# Patient Record
Sex: Female | Born: 1960 | Race: White | Hispanic: No | Marital: Married | Smoking: Never smoker
Health system: Southern US, Community
[De-identification: ages and names within clinical notes are randomized; demographics above are authoritative.]

## PROBLEM LIST (undated history)

## (undated) DIAGNOSIS — I493 Ventricular premature depolarization: Secondary | ICD-10-CM

## (undated) DIAGNOSIS — R001 Bradycardia, unspecified: Secondary | ICD-10-CM

## (undated) DIAGNOSIS — M7752 Other enthesopathy of left foot: Secondary | ICD-10-CM

## (undated) HISTORY — PX: TRANSTHORACIC ECHOCARDIOGRAM: SHX275

## (undated) HISTORY — DX: Ventricular premature depolarization: I49.3

## (undated) HISTORY — DX: Other enthesopathy of left foot and ankle: M77.52

## (undated) HISTORY — PX: AUGMENTATION MAMMAPLASTY: SUR837

## (undated) HISTORY — DX: Bradycardia, unspecified: R00.1

---

## 2007-10-18 LAB — HM PAP SMEAR: HM Pap smear: NORMAL

## 2012-04-08 ENCOUNTER — Ambulatory Visit: Payer: Self-pay | Admitting: Internal Medicine

## 2012-07-24 ENCOUNTER — Ambulatory Visit (INDEPENDENT_AMBULATORY_CARE_PROVIDER_SITE_OTHER): Payer: Self-pay | Admitting: Internal Medicine

## 2012-07-24 ENCOUNTER — Encounter: Payer: Self-pay | Admitting: Internal Medicine

## 2012-07-24 VITALS — BP 120/86 | HR 84 | Temp 98.5°F | Ht 65.0 in | Wt 197.0 lb

## 2012-07-24 DIAGNOSIS — M659 Synovitis and tenosynovitis, unspecified: Secondary | ICD-10-CM

## 2012-07-24 DIAGNOSIS — M7750 Other enthesopathy of unspecified foot: Secondary | ICD-10-CM

## 2012-07-24 DIAGNOSIS — R030 Elevated blood-pressure reading, without diagnosis of hypertension: Secondary | ICD-10-CM

## 2012-07-24 DIAGNOSIS — Z1239 Encounter for other screening for malignant neoplasm of breast: Secondary | ICD-10-CM

## 2012-07-24 DIAGNOSIS — R232 Flushing: Secondary | ICD-10-CM

## 2012-07-24 DIAGNOSIS — N951 Menopausal and female climacteric states: Secondary | ICD-10-CM

## 2012-07-24 NOTE — Patient Instructions (Addendum)
Your blood pressure is mildly elevated (144/84 by Dr Melina Schools check)  Please check it 3 times over the next month while calmly resting and e mail me the results (pulse, too)    Return after Easter for fasting labs and a urine test for protein    Mammogram to be set up at [facility]   Return for a full physical in the next few months

## 2012-07-24 NOTE — Progress Notes (Signed)
Patient ID: Wendy Gonzales, female   DOB: [birth year], 52 y.o.   MRN: [id number]  Patient Active Problem List  Diagnosis  . Perimenopause  . Tendonitis of ankle or foot  . Elevated blood pressure (not hypertension)    Subjective:  CC:   Chief Complaint  Patient presents with  . Establish Care    HPI:   Wendy Gonzales is a 52 y.o. female who presents as a new patient to establish primary care with the chief complaint of  Perimenopause. Her last menstrual period was 5 months ago. She has been having frequent hot flashes which are waking her up at times. She's not drenching the bed but she is nonetheless uncomfortable. She has no other symptoms of menopause including vaginal dryness and mood lability. She was under the impression that she had already entered menopause and was disappointed to find that she could still conceive despite being so miserable. She is not interested in hormone therapy. She has a healthy sense of humor about her situation.   Foot pain. She developed tendinitis in the left foot 9 months ago after running into a chair at work and inflicting.trauma to the top of her foot. She did not see any bruising and  add only minimal swelling so she did not have it x-rayed or evaluated at the time. Her swelling was worse by the end of the day so she would ice it would come home. However after several months of persistent pain she went to an urgent care last month and received a prescription for ibuprofen 800 mg every 8 hours which has been taking for the past 2 weeks.  Her pain is finally improving but not resolved.      Past Medical History  Diagnosis Date  . Tendonitis of ankle, left     History reviewed. No pertinent past surgical history.  Family History  Problem Relation Age of Onset  . Cataracts Mother   . Hypertension Mother   . Hypertension Father     History   Social History  . Marital Status: Married    Spouse Name: N/A    Number of Children: 2  . Years of  Education: 18   Occupational History  . teacher     Clinical cytogeneticist.   Social History Main Topics  . Smoking status: Never Smoker   . Smokeless tobacco: Never Used  . Alcohol Use: 2.4 oz/week    4 Glasses of wine per week     Comment: Socially  . Drug Use: No  . Sexually Active: Not on file   Other Topics Concern  . Not on file   Social History Narrative  . School Runner, broadcasting/film/video at [employer]    Allergies  Allergen Reactions  . Sulfa Antibiotics Rash      Review of Systems:   Patient denies headache, fevers, malaise, unintentional weight loss, skin rash, eye pain, sinus congestion and sinus pain, sore throat, dysphagia,  hemoptysis , cough, dyspnea, wheezing, chest pain, palpitations, orthopnea, edema, abdominal pain, nausea, melena, diarrhea, constipation, flank pain, dysuria, hematuria, urinary  Frequency, nocturia, numbness, tingling, seizures,  Focal weakness, Loss of consciousness,  Tremor, insomnia, depression, anxiety, and suicidal ideation.    Objective:  BP 120/86  Pulse 84  Temp(Src) 98.5 F (36.9 C) (Oral)  Ht 5\' 5"  (1.651 m)  Wt 197 lb (89.359 kg)  BMI 32.78 kg/m2  SpO2 97%  LMP 03/25/2012  General appearance: alert, cooperative and appears stated age Ears: normal TM's  and external ear canals both ears Throat: lips, mucosa, and tongue normal; teeth and gums normal Neck: no adenopathy, no carotid bruit, supple, symmetrical, trachea midline and thyroid not enlarged, symmetric, no tenderness/mass/nodules Back: symmetric, no curvature. ROM normal. No CVA tenderness. Lungs: clear to auscultation bilaterally Heart: regular rate and rhythm, S1, S2 normal, no murmur, click, rub or gallop Abdomen: soft, non-tender; bowel sounds normal; no masses,  no organomegaly Pulses: 2+ and symmetric Skin: Skin color, texture, turgor normal. No rashes or lesions Lymph nodes: Cervical, supraclavicular, and axillary nodes normal.  Assessment and  Plan:  Perimenopause Discussed her current symptoms and what she can expect. She is able to manage her symptoms with a sense of humor and does not want any hormonal therapy at this time. Cautioned her to use barrier protection for birth control until she has gone a year without a period.  Tendonitis of ankle or foot Chronic secondary to blunt trauma experienced about 8 months ago. She's been taking an anti-inflammatory for 2 weeks. Recommend continuing to do so for the next 2 weeks. No gastritis symptoms. If her symptoms have not significantly improved we will refer her to Dr. Gwyneth Revels, podiatry for evaluation.  Elevated blood pressure (not hypertension) She has no history of hypertension but has not had any primary care in several years. Since her mother has hypertension and has a home cuff I have asked her to check her pressure several times over the next several weeks along with pulse. I will have her return for fasting labs and a full physical to get her caught up on screening including Pap smears mammograms and colon cancer screening   Updated Medication List Outpatient Encounter Prescriptions as of 07/24/2012  Medication Sig Dispense Refill  . ibuprofen (ADVIL,MOTRIN) 600 MG tablet Take 600 mg by mouth every 6 (six) hours as needed for pain.       No facility-administered encounter medications on file as of 07/24/2012.     Orders Placed This Encounter  Procedures  . MM Digital Screening  . CBC with Differential  . TSH  . Comprehensive metabolic panel  . Lipid panel  . Microalbumin / creatinine urine ratio    No Follow-up on file.

## 2012-07-26 ENCOUNTER — Encounter: Payer: Self-pay | Admitting: Internal Medicine

## 2012-07-26 DIAGNOSIS — N951 Menopausal and female climacteric states: Secondary | ICD-10-CM | POA: Insufficient documentation

## 2012-07-26 DIAGNOSIS — M7750 Other enthesopathy of unspecified foot: Secondary | ICD-10-CM | POA: Insufficient documentation

## 2012-07-26 DIAGNOSIS — R030 Elevated blood-pressure reading, without diagnosis of hypertension: Secondary | ICD-10-CM | POA: Insufficient documentation

## 2012-07-26 NOTE — Assessment & Plan Note (Signed)
Discussed her current symptoms and what she can expect. She is able to manage her symptoms with a sense of humor and is not want any hormonal therapy at this time. Cautioned her to use it least barrier protection for birth control until she has gone a year without a period.

## 2012-07-26 NOTE — Assessment & Plan Note (Signed)
She has no history of hypertension but has not had any primary care in several years. Since her mother has a blood pressure cuff I have asked her to check her pressure several times over the next several weeks and 2 record pulses well. I will have her return for fasting labs and a full physical to get her caught up on screening including Pap smears mammograms and colon cancer screening

## 2012-07-26 NOTE — Assessment & Plan Note (Signed)
Chronic secondary to blunt trauma experienced about 8 months ago. She's been taking an anti-inflammatory for 2 weeks. Recommend continuing to do so for the next 2 weeks. Her symptoms have not significantly improved we will refer her to Dr. Ether Griffins, podiatry for evaluation.

## 2012-08-13 ENCOUNTER — Other Ambulatory Visit (INDEPENDENT_AMBULATORY_CARE_PROVIDER_SITE_OTHER): Payer: Self-pay

## 2012-08-13 DIAGNOSIS — R232 Flushing: Secondary | ICD-10-CM

## 2012-08-13 DIAGNOSIS — R030 Elevated blood-pressure reading, without diagnosis of hypertension: Secondary | ICD-10-CM

## 2012-08-13 DIAGNOSIS — N951 Menopausal and female climacteric states: Secondary | ICD-10-CM

## 2012-08-13 LAB — CBC WITH DIFFERENTIAL/PLATELET
Basophils Absolute: 0 K/uL (ref 0.0–0.1)
Basophils Relative: 0.6 % (ref 0.0–3.0)
Eosinophils Absolute: 0.1 K/uL (ref 0.0–0.7)
Eosinophils Relative: 1 % (ref 0.0–5.0)
HCT: 38.8 % (ref 36.0–46.0)
Hemoglobin: 13.2 g/dL (ref 12.0–15.0)
Lymphocytes Relative: 26.5 % (ref 12.0–46.0)
Lymphs Abs: 1.6 K/uL (ref 0.7–4.0)
MCHC: 33.9 g/dL (ref 30.0–36.0)
MCV: 88 fl (ref 78.0–100.0)
Monocytes Absolute: 0.4 K/uL (ref 0.1–1.0)
Monocytes Relative: 7 % (ref 3.0–12.0)
Neutro Abs: 4 K/uL (ref 1.4–7.7)
Neutrophils Relative %: 64.9 % (ref 43.0–77.0)
Platelets: 307 K/uL (ref 150.0–400.0)
RBC: 4.41 Mil/uL (ref 3.87–5.11)
RDW: 13.9 % (ref 11.5–14.6)
WBC: 6.2 K/uL (ref 4.5–10.5)

## 2012-08-13 LAB — COMPREHENSIVE METABOLIC PANEL
AST: 28 U/L (ref 0–37)
BUN: 11 mg/dL (ref 6–23)
Calcium: 9.2 mg/dL (ref 8.4–10.5)
Chloride: 102 mEq/L (ref 96–112)
Creatinine, Ser: 0.8 mg/dL (ref 0.4–1.2)
GFR: 84.83 mL/min (ref >60.00)
Total Bilirubin: 0.7 mg/dL (ref 0.3–1.2)

## 2012-08-13 LAB — LIPID PANEL
HDL: 52.9 mg/dL (ref >39.00)
Triglycerides: 96 mg/dL (ref 0.0–149.0)
VLDL: 19.2 mg/dL (ref 0.0–40.0)

## 2012-08-13 LAB — MICROALBUMIN / CREATININE URINE RATIO
Creatinine,U: 40.1 mg/dL
Microalb Creat Ratio: 0.2 mg/g (ref 0.0–30.0)
Microalb, Ur: 0.1 mg/dL (ref 0.0–1.9)

## 2012-08-13 LAB — LDL CHOLESTEROL, DIRECT: Direct LDL: 165.6 mg/dL

## 2012-08-14 ENCOUNTER — Encounter: Payer: Self-pay | Admitting: Internal Medicine

## 2012-09-04 ENCOUNTER — Encounter: Payer: Self-pay | Admitting: Internal Medicine

## 2012-09-27 LAB — HM PAP SMEAR: HM PAP: NORMAL

## 2012-10-09 ENCOUNTER — Encounter: Payer: Self-pay | Admitting: Internal Medicine

## 2012-10-13 ENCOUNTER — Encounter: Payer: Self-pay | Admitting: Internal Medicine

## 2012-10-14 ENCOUNTER — Encounter: Payer: Self-pay | Admitting: Internal Medicine

## 2012-10-16 ENCOUNTER — Encounter: Payer: Self-pay | Admitting: Internal Medicine

## 2012-10-17 ENCOUNTER — Encounter: Payer: Self-pay | Admitting: Internal Medicine

## 2012-10-17 ENCOUNTER — Other Ambulatory Visit (HOSPITAL_COMMUNITY)
Admission: RE | Admit: 2012-10-17 | Discharge: 2012-10-17 | Disposition: A | Discharge status: Home / Self Care | Payer: BC Managed Care – PPO | Source: Ambulatory Visit | Attending: Internal Medicine | Admitting: Internal Medicine

## 2012-10-17 ENCOUNTER — Ambulatory Visit (INDEPENDENT_AMBULATORY_CARE_PROVIDER_SITE_OTHER): Payer: BC Managed Care – PPO | Admitting: Internal Medicine

## 2012-10-17 VITALS — BP 118/78 | HR 84 | Temp 98.5°F | Resp 16 | Ht 66.0 in | Wt 186.2 lb

## 2012-10-17 DIAGNOSIS — N951 Menopausal and female climacteric states: Secondary | ICD-10-CM

## 2012-10-17 DIAGNOSIS — Z01419 Encounter for gynecological examination (general) (routine) without abnormal findings: Secondary | ICD-10-CM | POA: Insufficient documentation

## 2012-10-17 DIAGNOSIS — R030 Elevated blood-pressure reading, without diagnosis of hypertension: Secondary | ICD-10-CM

## 2012-10-17 DIAGNOSIS — Z1239 Encounter for other screening for malignant neoplasm of breast: Secondary | ICD-10-CM

## 2012-10-17 DIAGNOSIS — Z1151 Encounter for screening for human papillomavirus (HPV): Secondary | ICD-10-CM | POA: Insufficient documentation

## 2012-10-17 DIAGNOSIS — Z0000 Encounter for general adult medical examination without abnormal findings: Secondary | ICD-10-CM

## 2012-10-17 NOTE — Progress Notes (Signed)
Patient ID: Wendy Gonzales, female   DOB: May 12, 1960, 52 y.o.   MRN: [id number]  Subjective:     Wendy Gonzales is a 52 y.o. female and is here for a comprehensive physical exam. The patient reports .Marland Kitchen  History   Social History  . Marital Status: Married    Spouse Name: N/A    Number of Children: 2  . Years of Education: 18   Occupational History  . teacher     Clinical cytogeneticist.   Social History Main Topics  . Smoking status: Never Smoker   . Smokeless tobacco: Never Used  . Alcohol Use: 2.4 oz/week    4 Glasses of wine per week     Comment: Socially  . Drug Use: No  . Sexually Active: Not on file   Other Topics Concern  . Not on file   Social History Narrative  . No narrative on file   Health Maintenance  Topic Date Due  . Tetanus/tdap  04/14/1979  . Mammogram  04/13/2010  . Colonoscopy  04/13/2010  . Pap Smear  10/18/2010  . Influenza Vaccine  12/23/2012    The following portions of the patient's history were reviewed and updated as appropriate: allergies, current medications, past family history, past medical history, past social history, past surgical history and problem list.  Review of Systems A comprehensive review of systems was negative.   Objective:  BP 118/78  Pulse 84  Temp(Src) 98.5 F (36.9 C) (Oral)  Resp 16  Ht 5\' 6"  (1.676 m)  Wt 186 lb 4 oz (84.482 kg)  BMI 30.08 kg/m2  SpO2 98%  LMP 03/25/2012  General Appearance:    Alert, cooperative, no distress, appears stated age  Head:    Normocephalic, without obvious abnormality, atraumatic  Eyes:    PERRL, conjunctiva/corneas clear, EOM's intact, fundi    benign, both eyes  Ears:    Normal TM's and external ear canals, both ears  Nose:   Nares normal, septum midline, mucosa normal, no drainage    or sinus tenderness  Throat:   Lips, mucosa, and tongue normal; teeth and gums normal  Neck:   Supple, symmetrical, trachea midline, no adenopathy;    thyroid:  no enlargement/tenderness/nodules;  no carotid   bruit or JVD  Back:     Symmetric, no curvature, ROM normal, no CVA tenderness  Lungs:     Clear to auscultation bilaterally, respirations unlabored  Chest Wall:    No tenderness or deformity   Heart:    Regular rate and rhythm, S1 and S2 normal, no murmur, rub   or gallop  Breast Exam:    No tenderness, masses, or nipple abnormality  Abdomen:     Soft, non-tender, bowel sounds active all four quadrants,    no masses, no organomegaly  Genitalia:    Pelvic: cervix normal in appearance, external genitalia normal, no adnexal masses or tenderness, no cervical motion tenderness, rectovaginal septum normal, uterus normal size, shape, and consistency and vagina normal without discharge  Extremities:   Extremities normal, atraumatic, no cyanosis or edema  Pulses:   2+ and symmetric all extremities  Skin:   Skin color, texture, turgor normal, no rashes or lesions  Lymph nodes:   Cervical, supraclavicular, and axillary nodes normal  Neurologic:   CNII-XII intact, normal strength, sensation and reflexes    throughout    Assessment:   Elevated blood pressure (not hypertension) Home bps are normal,  checking urine for protein  Routine general medical examination  at a health care facility Annual comprehensive exam was done including breast, pelvic and PAP smear. All screenings have been addressed .   Perimenopause Discussed therapy, hormonal and non hormonal. She does not want SSRIs or hormonal therapy trial; Black cohosh root trial recommended    Updated Medication List Outpatient Encounter Prescriptions as of 10/17/2012  Medication Sig Dispense Refill  . ibuprofen (ADVIL,MOTRIN) 600 MG tablet Take 600 mg by mouth every 6 (six) hours as needed for pain.       No facility-administered encounter medications on file as of 10/17/2012.

## 2012-10-17 NOTE — Assessment & Plan Note (Addendum)
Home bps are normal,  checking urine for protein

## 2012-10-17 NOTE — Patient Instructions (Addendum)
You hade your PAP smear done today.  Results should be available in about 1 eek  Please make your mammogram appt at [facility] (here in [place] )  Do the take home stool cards soo nfor colon CA screening  try black cohosh root for hot flashes

## 2012-10-19 ENCOUNTER — Encounter: Payer: Self-pay | Admitting: Internal Medicine

## 2012-10-19 NOTE — Assessment & Plan Note (Signed)
Discussed therapy, hormonal and non hormonal. She does not want SSRIs or hormonal therapy trial; Black cohosh root trial recommended

## 2012-10-19 NOTE — Assessment & Plan Note (Signed)
Annual comprehensive exam was done including breast, pelvic and PAP smear. All screenings have been addressed .  

## 2013-01-27 ENCOUNTER — Encounter: Payer: Self-pay | Admitting: Emergency Medicine

## 2013-02-27 ENCOUNTER — Other Ambulatory Visit: Payer: Self-pay

## 2013-08-06 ENCOUNTER — Ambulatory Visit (INDEPENDENT_AMBULATORY_CARE_PROVIDER_SITE_OTHER): Payer: 59 | Admitting: Adult Health

## 2013-08-06 ENCOUNTER — Encounter: Payer: Self-pay | Admitting: Adult Health

## 2013-08-06 VITALS — BP 114/76 | HR 88 | Temp 98.5°F | Resp 14 | Wt 192.0 lb

## 2013-08-06 DIAGNOSIS — L0390 Cellulitis, unspecified: Secondary | ICD-10-CM

## 2013-08-06 DIAGNOSIS — L0291 Cutaneous abscess, unspecified: Secondary | ICD-10-CM

## 2013-08-06 MED ORDER — DOXYCYCLINE HYCLATE 100 MG PO TABS
100.0000 mg | ORAL_TABLET | Freq: Two times a day (BID) | ORAL | Status: DC
Start: 2013-08-06 — End: 2014-10-28

## 2013-08-06 NOTE — Patient Instructions (Signed)
  Start doxycycline twice a day for 10 days.  Apply warm compresses multiple times throughout the day. Sit in warm bath.  Do not attempt to pop. If the area does not show signs of healing within 5 days please call.  The area may need to be lanced if not fully improved.  Tylenol or ibuprofen for pain.

## 2013-08-06 NOTE — Progress Notes (Signed)
Pre visit review using our clinic review tool, if applicable. No additional management support is needed unless otherwise documented below in the visit note. 

## 2013-08-06 NOTE — Progress Notes (Signed)
Patient ID: Wendy Gonzales, female   DOB: Feb 20, 1961, 53 y.o.   MRN: [id number]   Subjective:    Patient ID: Wendy Gonzales, female    DOB: Feb 20, 1961, 53 y.o.   MRN: [id number]  HPI  Pt is a pleasant 53 y/o female who presents with a painful, swollen area on the right groin. First noticed ~ 3-4 days ago. No fever, chills or general body discomfort. She tried to pop it without any results. She has applied heat to the area but only did this a couple of times.   Past Medical History  Diagnosis Date  . Tendonitis of ankle, left     Current Outpatient Prescriptions on File Prior to Visit  Medication Sig Dispense Refill  . ibuprofen (ADVIL,MOTRIN) 600 MG tablet Take 600 mg by mouth every 6 (six) hours as needed for pain.       No current facility-administered medications on file prior to visit.     Review of Systems  Constitutional: Negative for fever and chills.  Musculoskeletal: Negative.   Skin:       Swollen, red area on right groin  Neurological: Negative.   All other systems reviewed and are negative.      Objective:  BP 114/76  Pulse 88  Temp(Src) 98.5 F (36.9 C) (Oral)  Resp 14  Wt 192 lb (87.091 kg)  SpO2 98%  LMP 03/25/2012   Physical Exam  Constitutional: She is oriented to person, place, and time. She appears well-developed and well-nourished. No distress.  HENT:  Head: Normocephalic and atraumatic.  Eyes: Conjunctivae and EOM are normal.  Neck: Normal range of motion. Neck supple.  Cardiovascular: Normal rate and regular rhythm.   Pulmonary/Chest: Effort normal. No respiratory distress.  Musculoskeletal: Normal range of motion.  Neurological: She is alert and oriented to person, place, and time. She has normal reflexes. Coordination normal.  Skin:  Right groin abscess ~ 3 cm. No drainage. Induration.  Psychiatric: She has a normal mood and affect. Her behavior is normal. Judgment and thought content normal.      Assessment & Plan:   1.  Abscess Doxycycline bid x 10 days. Warm compresses multiple times throughout the day. Do not attempt to manipulate the area. Report fever, worsening symptoms. If no improvement will refer to general sgy to lance.

## 2014-10-20 ENCOUNTER — Ambulatory Visit: Payer: Self-pay | Admitting: Internal Medicine

## 2014-10-28 ENCOUNTER — Encounter: Payer: Self-pay | Admitting: Internal Medicine

## 2014-10-28 ENCOUNTER — Ambulatory Visit (INDEPENDENT_AMBULATORY_CARE_PROVIDER_SITE_OTHER): Payer: 59 | Admitting: Internal Medicine

## 2014-10-28 VITALS — BP 131/85 | HR 84 | Temp 98.2°F | Ht 65.0 in | Wt 199.4 lb

## 2014-10-28 DIAGNOSIS — R5383 Other fatigue: Secondary | ICD-10-CM

## 2014-10-28 DIAGNOSIS — Z1159 Encounter for screening for other viral diseases: Secondary | ICD-10-CM

## 2014-10-28 DIAGNOSIS — E785 Hyperlipidemia, unspecified: Secondary | ICD-10-CM

## 2014-10-28 DIAGNOSIS — E559 Vitamin D deficiency, unspecified: Secondary | ICD-10-CM | POA: Diagnosis not present

## 2014-10-28 DIAGNOSIS — R030 Elevated blood-pressure reading, without diagnosis of hypertension: Secondary | ICD-10-CM

## 2014-10-28 DIAGNOSIS — Z0000 Encounter for general adult medical examination without abnormal findings: Secondary | ICD-10-CM | POA: Diagnosis not present

## 2014-10-28 DIAGNOSIS — Z1239 Encounter for other screening for malignant neoplasm of breast: Secondary | ICD-10-CM

## 2014-10-28 DIAGNOSIS — E669 Obesity, unspecified: Secondary | ICD-10-CM

## 2014-10-28 NOTE — Progress Notes (Signed)
Pre visit review using our clinic review tool, if applicable. No additional management support is needed unless otherwise documented below in the visit note. 

## 2014-10-28 NOTE — Patient Instructions (Addendum)
I want you to lose 15 lbs over the next 6 months  to get your BMI < 30.  Please return for fasting labs soon  Mammogram ordered , will get scheduled at [facility]  The colon cancer screening test will be mailed to your home. In a few days   Health Maintenance Adopting a healthy lifestyle and getting preventive care can go a long way to promote health and wellness. Talk with your health care provider about what schedule of regular examinations is right for you. This is a good chance for you to check in with your provider about disease prevention and staying healthy. In between checkups, there are plenty of things you can do on your own. Experts have done a lot of research about which lifestyle changes and preventive measures are most likely to keep you healthy. Ask your health care provider for more information. WEIGHT AND DIET  Eat a healthy diet  Be sure to include plenty of vegetables, fruits, low-fat dairy products, and lean protein.  Do not eat a lot of foods high in solid fats, added sugars, or salt.  Get regular exercise. This is one of the most important things you can do for your health.  Most adults should exercise for at least 150 minutes each week. The exercise should increase your heart rate and make you sweat (moderate-intensity exercise).  Most adults should also do strengthening exercises at least twice a week. This is in addition to the moderate-intensity exercise.  Maintain a healthy weight  Body mass index (BMI) is a measurement that can be used to identify possible weight problems. It estimates body fat based on height and weight. Your health care provider can help determine your BMI and help you achieve or maintain a healthy weight.  For females 27 years of age and older:   A BMI below 18.5 is considered underweight.  A BMI of 18.5 to 24.9 is normal.  A BMI of 25 to 29.9 is considered overweight.  A BMI of 30 and above is considered obese.  Watch levels of  cholesterol and blood lipids  You should start having your blood tested for lipids and cholesterol at 55 years of age, then have this test every 5 years.  You may need to have your cholesterol levels checked more often if:  Your lipid or cholesterol levels are high.  You are older than 54 years of age.  You are at high risk for heart disease.  CANCER SCREENING   Lung Cancer  Lung cancer screening is recommended for adults 39-31 years old who are at high risk for lung cancer because of a history of smoking.  A yearly low-dose CT scan of the lungs is recommended for people who:  Currently smoke.  Have quit within the past 15 years.  Have at least a 30-pack-year history of smoking. A pack year is smoking an average of one pack of cigarettes a day for 1 year.  Yearly screening should continue until it has been 15 years since you quit.  Yearly screening should stop if you develop a health problem that would prevent you from having lung cancer treatment.  Breast Cancer  Practice breast self-awareness. This means understanding how your breasts normally appear and feel.  It also means doing regular breast self-exams. Let your health care provider know about any changes, no matter how small.  If you are in your 20s or 30s, you should have a clinical breast exam (CBE) by a health care provider every  1-3 years as part of a regular health exam.  If you are 31 or older, have a CBE every year. Also consider having a breast X-ray (mammogram) every year.  If you have a family history of breast cancer, talk to your health care provider about genetic screening.  If you are at high risk for breast cancer, talk to your health care provider about having an MRI and a mammogram every year.  Breast cancer gene (BRCA) assessment is recommended for women who have family members with BRCA-related cancers. BRCA-related cancers include:  Breast.  Ovarian.  Tubal.  Peritoneal  cancers.  Results of the assessment will determine the need for genetic counseling and BRCA1 and BRCA2 testing. Cervical Cancer Routine pelvic examinations to screen for cervical cancer are no longer recommended for nonpregnant women who are considered low risk for cancer of the pelvic organs (ovaries, uterus, and vagina) and who do not have symptoms. A pelvic examination may be necessary if you have symptoms including those associated with pelvic infections. Ask your health care provider if a screening pelvic exam is right for you.   The Pap test is the screening test for cervical cancer for women who are considered at risk.  If you had a hysterectomy for a problem that was not cancer or a condition that could lead to cancer, then you no longer need Pap tests.  If you are older than 65 years, and you have had normal Pap tests for the past 10 years, you no longer need to have Pap tests.  If you have had past treatment for cervical cancer or a condition that could lead to cancer, you need Pap tests and screening for cancer for at least 20 years after your treatment.  If you no longer get a Pap test, assess your risk factors if they change (such as having a new sexual partner). This can affect whether you should start being screened again.  Some women have medical problems that increase their chance of getting cervical cancer. If this is the case for you, your health care provider may recommend more frequent screening and Pap tests.  The human papillomavirus (HPV) test is another test that may be used for cervical cancer screening. The HPV test looks for the virus that can cause cell changes in the cervix. The cells collected during the Pap test can be tested for HPV.  The HPV test can be used to screen women 19 years of age and older. Getting tested for HPV can extend the interval between normal Pap tests from three to five years.  An HPV test also should be used to screen women of any age who  have unclear Pap test results.  After 55 years of age, women should have HPV testing as often as Pap tests.  Colorectal Cancer  This type of cancer can be detected and often prevented.  Routine colorectal cancer screening usually begins at 55 years of age and continues through 55 years of age.  Your health care provider may recommend screening at an earlier age if you have risk factors for colon cancer.  Your health care provider may also recommend using home test kits to check for hidden blood in the stool.  A small camera at the end of a tube can be used to examine your colon directly (sigmoidoscopy or colonoscopy). This is done to check for the earliest forms of colorectal cancer.  Routine screening usually begins at age 66.  Direct examination of the colon should be  repeated every 5-10 years through 55 years of age. However, you may need to be screened more often if early forms of precancerous polyps or small growths are found. Skin Cancer  Check your skin from head to toe regularly.  Tell your health care provider about any new moles or changes in moles, especially if there is a change in a mole's shape or color.  Also tell your health care provider if you have a mole that is larger than the size of a pencil eraser.  Always use sunscreen. Apply sunscreen liberally and repeatedly throughout the day.  Protect yourself by wearing long sleeves, pants, a wide-brimmed hat, and sunglasses whenever you are outside. HEART DISEASE, DIABETES, AND HIGH BLOOD PRESSURE   Have your blood pressure checked at least every 1-2 years. High blood pressure causes heart disease and increases the risk of stroke.  If you are between 30 years and 50 years old, ask your health care provider if you should take aspirin to prevent strokes.  Have regular diabetes screenings. This involves taking a blood sample to check your fasting blood sugar level.  If you are at a normal weight and have a low risk for  diabetes, have this test once every three years after 55 years of age.  If you are overweight and have a high risk for diabetes, consider being tested at a younger age or more often. PREVENTING INFECTION  Hepatitis B  If you have a higher risk for hepatitis B, you should be screened for this virus. You are considered at high risk for hepatitis B if:  You were born in a country where hepatitis B is common. Ask your health care provider which countries are considered high risk.  Your parents were born in a high-risk country, and you have not been immunized against hepatitis B (hepatitis B vaccine).  You have HIV or AIDS.  You use needles to inject street drugs.  You live with someone who has hepatitis B.  You have had sex with someone who has hepatitis B.  You get hemodialysis treatment.  You take certain medicines for conditions, including cancer, organ transplantation, and autoimmune conditions. Hepatitis C  Blood testing is recommended for:  Everyone born from [birth year] through 1966.  Anyone with known risk factors for hepatitis C. Sexually transmitted infections (STIs)  You should be screened for sexually transmitted infections (STIs) including gonorrhea and chlamydia if:  You are sexually active and are younger than 55 years of age.  You are older than 55 years of age and your health care provider tells you that you are at risk for this type of infection.  Your sexual activity has changed since you were last screened and you are at an increased risk for chlamydia or gonorrhea. Ask your health care provider if you are at risk.  If you do not have HIV, but are at risk, it may be recommended that you take a prescription medicine daily to prevent HIV infection. This is called pre-exposure prophylaxis (PrEP). You are considered at risk if:  You are sexually active and do not regularly use condoms or know the HIV status of your partner(s).  You take drugs by injection.  You are  sexually active with a partner who has HIV. Talk with your health care provider about whether you are at high risk of being infected with HIV. If you choose to begin PrEP, you should first be tested for HIV. You should then be tested every 3 months for as long  as you are taking PrEP.  PREGNANCY   If you are premenopausal and you may become pregnant, ask your health care provider about preconception counseling.  If you may become pregnant, take 400 to 800 micrograms (mcg) of folic acid every day.  If you want to prevent pregnancy, talk to your health care provider about birth control (contraception). OSTEOPOROSIS AND MENOPAUSE   Osteoporosis is a disease in which the bones lose minerals and strength with aging. This can result in serious bone fractures. Your risk for osteoporosis can be identified using a bone density scan.  If you are 21 years of age or older, or if you are at risk for osteoporosis and fractures, ask your health care provider if you should be screened.  Ask your health care provider whether you should take a calcium or vitamin D supplement to lower your risk for osteoporosis.  Menopause may have certain physical symptoms and risks.  Hormone replacement therapy may reduce some of these symptoms and risks. Talk to your health care provider about whether hormone replacement therapy is right for you.  HOME CARE INSTRUCTIONS   Schedule regular health, dental, and eye exams.  Stay current with your immunizations.   Do not use any tobacco products including cigarettes, chewing tobacco, or electronic cigarettes.  If you are pregnant, do not drink alcohol.  If you are breastfeeding, limit how much and how often you drink alcohol.  Limit alcohol intake to no more than 1 drink per day for nonpregnant women. One drink equals 12 ounces of beer, 5 ounces of wine, or 1 ounces of hard liquor.  Do not use street drugs.  Do not share needles.  Ask your health care provider for  help if you need support or information about quitting drugs.  Tell your health care provider if you often feel depressed.  Tell your health care provider if you have ever been abused or do not feel safe at home. Document Released: 10/24/2010 Document Revised: 08/25/2013 Document Reviewed: 03/12/2013 [facility] Patient Information 2015 [place], [place]. This information is not intended to replace advice given to you by your health care provider. Make sure you discuss any questions you have with your health care provider.

## 2014-10-28 NOTE — Progress Notes (Signed)
Patient ID: Wendy Gonzales, female    DOB: [birth year]  Age: 54 y.o. MRN: [id number]  The patient is here for her annual  wellness examination . LAST seen IN 2014   Normal PAP 2014 Mammogram due,  WAS ORDEREDTWICE IN  IN 2014 BUT NEVER DONE  Colonoscopy overdue SINCE AGE 23  The risk factors are reflected in the social history.  The roster of all physicians providing medical care to patient - is listed in the Snapshot section of the chart.  Activities of daily living:  The patient is 100% independent in all ADLs: dressing, toileting, feeding as well as independent mobility  Home safety : The patient has smoke detectors in the home. They wear seatbelts.  There are no firearms at home. There is no violence in the home.   There is no risks for hepatitis, STDs or HIV. There is no   history of blood transfusion. They have no travel history to infectious disease endemic areas of the world.  The patient has seen their dentist in the last six month. They have seen their eye doctor in the last year. They admit to slight hearing difficulty with regard to whispered voices and some television programs.  They have deferred audiologic testing in the last year.  They do not  have excessive sun exposure. Discussed the need for sun protection: hats, long sleeves and use of sunscreen if there is significant sun exposure.   Diet: the importance of a healthy diet is discussed. They do have a healthy diet.  The benefits of regular aerobic exercise were discussed. She walks 4 times per week ,  20 minutes.   Depression screen: there are no signs or vegative symptoms of depression- irritability, change in appetite, anhedonia, sadness/tearfullness.  Cognitive assessment: the patient manages all their financial and personal affairs and is actively engaged. They could relate day,date,year and events; recalled 2/3 objects at 3 minutes; performed clock-face test normally.  The following portions of the patient's history  were reviewed and updated as appropriate: allergies, current medications, past family history, past medical history,  past surgical history, past social history  and problem list.  Visual acuity was not assessed per patient preference since she has regular follow up with her ophthalmologist. Hearing and body mass index were assessed and reviewed.   During the course of the visit the patient was educated and counseled about appropriate screening and preventive services including : fall prevention , diabetes screening, nutrition counseling, colorectal cancer screening, and recommended immunizations.    CC: The primary encounter diagnosis was Need for hepatitis C screening test. Diagnoses of Other fatigue, Vitamin D deficiency, Hyperlipidemia, Breast cancer screening, Visit for preventive health examination, Obesity, and Elevated blood pressure (not hypertension) were also pertinent to this visit.  History Moncerrath has a past medical history of Tendonitis of ankle, left.   She has no past surgical history on file.   Her family history includes Cataracts in her mother; Hypertension in her father and mother.She reports that she has never smoked. She has never used smokeless tobacco. She reports that she drinks about 2.4 oz of alcohol per week. She reports that she does not use illicit drugs.  Outpatient Prescriptions Prior to Visit  Medication Sig Dispense Refill  . doxycycline (VIBRA-TABS) 100 MG tablet Take 1 tablet (100 mg total) by mouth 2 (two) times daily. 20 tablet 0  . ibuprofen (ADVIL,MOTRIN) 600 MG tablet Take 600 mg by mouth every 6 (six) hours as needed for pain.  No facility-administered medications prior to visit.    Review of Systems   Patient denies headache, fevers, malaise, unintentional weight loss, skin rash, eye pain, sinus congestion and sinus pain, sore throat, dysphagia,  hemoptysis , cough, dyspnea, wheezing, chest pain, palpitations, orthopnea, edema, abdominal pain,  nausea, melena, diarrhea, constipation, flank pain, dysuria, hematuria, urinary  Frequency, nocturia, numbness, tingling, seizures,  Focal weakness, Loss of consciousness,  Tremor, insomnia, depression, anxiety, and suicidal ideation.      Objective:  BP 131/85 mmHg  Pulse 84  Temp(Src) 98.2 F (36.8 C) (Oral)  Ht 5\' 5"  (1.651 m)  Wt 199 lb 6 oz (90.436 kg)  BMI 33.18 kg/m2  SpO2 96%  LMP 03/25/2012  Physical Exam   General appearance: alert, cooperative and appears stated age Head: Normocephalic, without obvious abnormality, atraumatic Eyes: conjunctivae/corneas clear. PERRL, EOM's intact. Fundi benign. Ears: normal TM's and external ear canals both ears Nose: Nares normal. Septum midline. Mucosa normal. No drainage or sinus tenderness. Throat: lips, mucosa, and tongue normal; teeth and gums normal Neck: no adenopathy, no carotid bruit, no JVD, supple, symmetrical, trachea midline and thyroid not enlarged, symmetric, no tenderness/mass/nodules Lungs: clear to auscultation bilaterally Breasts: saline implants. normal appearance, no masses or tenderness Heart: regular rate and rhythm, S1, S2 normal, no murmur, click, rub or gallop Abdomen: soft, non-tender; bowel sounds normal; no masses,  no organomegaly Extremities: extremities normal, atraumatic, no cyanosis or edema Pulses: 2+ and symmetric Skin: Skin color, texture, turgor normal. No rashes or lesions Neurologic: Alert and oriented X 3, normal strength and tone. Normal symmetric reflexes. Normal coordination and gait.     Assessment & Plan:   Problem List Items Addressed This Visit      Unprioritized   Elevated blood pressure (not hypertension)    Home bps have been  normal,  checking urine for protein  Lab Results  Component Value Date   MICROALBUR 0.1 08/13/2012        Visit for preventive health examination    Annual wellness  exam was done as well as a comprehensive physical exam  .  During the course of the  visit the patient was educated and counseled about appropriate screening and preventive services and screenings were brought up to date for cervical and breast cancer .  She will return for fasting labs to provide samples for diabetes screening and lipid analysis with projected  10 year  risk for CAD. nutrition counseling, skin cancer screening has been recommended, along with review of the age appropriate recommended immunizations.  Printed recommendations for health maintenance screenings was given.        Obesity    I have addressed  BMI and recommended a low glycemic index diet utilizing smaller more frequent meals to increase metabolism.  I have also recommended that patient start exercising with a goal of 30 minutes of aerobic exercise a minimum of 5 days per week. Screening for lipid disorders, thyroid and diabetes need to be done ASAP.          Other Visit Diagnoses    Need for hepatitis C screening test    -  Primary    Relevant Orders    Hepatitis C antibody    Other fatigue        Relevant Orders    CBC with Differential/Platelet    Comprehensive metabolic panel    TSH    Vitamin D deficiency        Relevant Orders    Vit D  25 hydroxy (rtn osteoporosis monitoring)    Hyperlipidemia        Relevant Orders    Lipid panel    Breast cancer screening        Relevant Orders    MM DIGITAL SCREENING BILATERAL       I have discontinued Ms. Mcgranahan's ibuprofen and doxycycline.  No orders of the defined types were placed in this encounter.    Medications Discontinued During This Encounter  Medication Reason  . doxycycline (VIBRA-TABS) 100 MG tablet Completed Course  . ibuprofen (ADVIL,MOTRIN) 600 MG tablet Patient Preference    Follow-up: Return in about 6 months (around 04/30/2015).   Sherlene Shams, MD

## 2014-10-29 ENCOUNTER — Telehealth: Payer: Self-pay | Admitting: Internal Medicine

## 2014-10-29 NOTE — Telephone Encounter (Signed)
Faxed form for Cologuard with demographics.

## 2014-10-31 DIAGNOSIS — E669 Obesity, unspecified: Secondary | ICD-10-CM | POA: Insufficient documentation

## 2014-10-31 NOTE — Assessment & Plan Note (Signed)

## 2014-10-31 NOTE — Assessment & Plan Note (Addendum)
Home bps have been  normal,  checking urine for protein  Lab Results  Component Value Date   MICROALBUR 0.1 08/13/2012

## 2014-10-31 NOTE — Assessment & Plan Note (Signed)
I have addressed  BMI and recommended a low glycemic index diet utilizing smaller more frequent meals to increase metabolism.  I have also recommended that patient start exercising with a goal of 30 minutes of aerobic exercise a minimum of 5 days per week. Screening for lipid disorders, thyroid and diabetes need to be done ASAP.

## 2014-11-04 ENCOUNTER — Ambulatory Visit
Admission: RE | Admit: 2014-11-04 | Discharge: 2014-11-04 | Disposition: A | Discharge status: Home / Self Care | Payer: 59 | Source: Ambulatory Visit | Attending: Internal Medicine | Admitting: Internal Medicine

## 2014-11-04 ENCOUNTER — Other Ambulatory Visit: Payer: Self-pay | Admitting: Internal Medicine

## 2014-11-04 ENCOUNTER — Telehealth: Payer: Self-pay | Admitting: Internal Medicine

## 2014-11-04 DIAGNOSIS — N6459 Other signs and symptoms in breast: Secondary | ICD-10-CM

## 2014-11-04 DIAGNOSIS — Z1231 Encounter for screening mammogram for malignant neoplasm of breast: Secondary | ICD-10-CM | POA: Insufficient documentation

## 2014-11-04 DIAGNOSIS — N6489 Other specified disorders of breast: Secondary | ICD-10-CM

## 2014-11-04 DIAGNOSIS — N6320 Unspecified lump in the left breast, unspecified quadrant: Secondary | ICD-10-CM

## 2014-11-04 DIAGNOSIS — Z1239 Encounter for other screening for malignant neoplasm of breast: Secondary | ICD-10-CM

## 2014-11-04 LAB — COLOGUARD

## 2014-11-04 NOTE — Telephone Encounter (Signed)
Notified patient and she verbalized understanding.

## 2014-11-04 NOTE — Telephone Encounter (Signed)
Your  mammogram was reported to me as abnormal.   Additional images are needed of  Both breasts,  And Wendy Gonzales will contact you directly to get those scheduled. If you do not hear from them by Monday, please call the office .

## 2014-11-05 ENCOUNTER — Other Ambulatory Visit (INDEPENDENT_AMBULATORY_CARE_PROVIDER_SITE_OTHER): Payer: 59

## 2014-11-05 DIAGNOSIS — E785 Hyperlipidemia, unspecified: Secondary | ICD-10-CM | POA: Diagnosis not present

## 2014-11-05 DIAGNOSIS — R5383 Other fatigue: Secondary | ICD-10-CM

## 2014-11-05 LAB — CBC WITH DIFFERENTIAL/PLATELET
Basophils Absolute: 0 10*3/uL (ref 0.0–0.1)
Basophils Relative: 0.5 % (ref 0.0–3.0)
Eosinophils Absolute: 0.1 10*3/uL (ref 0.0–0.7)
Eosinophils Relative: 1 % (ref 0.0–5.0)
HCT: 41.6 % (ref 36.0–46.0)
Hemoglobin: 14.1 g/dL (ref 12.0–15.0)
LYMPHS PCT: 24.1 % (ref 12.0–46.0)
Lymphs Abs: 1.6 10*3/uL (ref 0.7–4.0)
MCHC: 33.8 g/dL (ref 30.0–36.0)
MCV: 88.4 fl (ref 78.0–100.0)
Monocytes Absolute: 0.5 10*3/uL (ref 0.1–1.0)
Monocytes Relative: 7 % (ref 3.0–12.0)
Neutro Abs: 4.6 10*3/uL (ref 1.4–7.7)
Neutrophils Relative %: 67.4 % (ref 43.0–77.0)
Platelets: 316 10*3/uL (ref 150.0–400.0)
RBC: 4.71 Mil/uL (ref 3.87–5.11)
RDW: 14.2 % (ref 11.5–15.5)
WBC: 6.9 10*3/uL (ref 4.0–10.5)

## 2014-11-05 LAB — COMPREHENSIVE METABOLIC PANEL
ALK PHOS: 115 U/L (ref 39–117)
ALT: 39 U/L — AB (ref 0–35)
AST: 42 U/L — ABNORMAL HIGH (ref 0–37)
Albumin: 4.3 g/dL (ref 3.5–5.2)
BILIRUBIN TOTAL: 0.5 mg/dL (ref 0.2–1.2)
BUN: 11 mg/dL (ref 6–23)
CHLORIDE: 104 meq/L (ref 96–112)
CO2: 28 mEq/L (ref 19–32)
CREATININE: 0.78 mg/dL (ref 0.40–1.20)
Calcium: 9.6 mg/dL (ref 8.4–10.5)
GFR: 81.63 mL/min (ref >60.00)
Glucose, Bld: 107 mg/dL — ABNORMAL HIGH (ref 70–99)
POTASSIUM: 4.2 meq/L (ref 3.5–5.1)
SODIUM: 139 meq/L (ref 135–145)
Total Protein: 7.8 g/dL (ref 6.0–8.3)

## 2014-11-05 LAB — LIPID PANEL
CHOLESTEROL: 225 mg/dL — AB (ref 0–200)
HDL: 65.3 mg/dL (ref >39.00)
LDL Cholesterol: 145 mg/dL — ABNORMAL HIGH (ref 0–99)
NONHDL: 159.7
Total CHOL/HDL Ratio: 3
Triglycerides: 74 mg/dL (ref 0.0–149.0)
VLDL: 14.8 mg/dL (ref 0.0–40.0)

## 2014-11-05 LAB — TSH: TSH: 0.91 u[IU]/mL (ref 0.35–4.50)

## 2014-11-08 ENCOUNTER — Encounter: Payer: Self-pay | Admitting: Internal Medicine

## 2014-11-08 ENCOUNTER — Other Ambulatory Visit: Payer: Self-pay | Admitting: Internal Medicine

## 2014-11-08 DIAGNOSIS — R7401 Elevation of levels of liver transaminase levels: Secondary | ICD-10-CM

## 2014-11-08 DIAGNOSIS — R740 Nonspecific elevation of levels of transaminase and lactic acid dehydrogenase [LDH]: Principal | ICD-10-CM

## 2014-11-11 ENCOUNTER — Ambulatory Visit
Admission: RE | Admit: 2014-11-11 | Discharge: 2014-11-11 | Disposition: A | Discharge status: Home / Self Care | Payer: 59 | Source: Ambulatory Visit | Attending: Internal Medicine | Admitting: Internal Medicine

## 2014-11-11 ENCOUNTER — Ambulatory Visit: Payer: 59

## 2014-11-11 DIAGNOSIS — N6320 Unspecified lump in the left breast, unspecified quadrant: Secondary | ICD-10-CM

## 2014-11-11 DIAGNOSIS — N6459 Other signs and symptoms in breast: Secondary | ICD-10-CM

## 2014-11-11 DIAGNOSIS — R928 Other abnormal and inconclusive findings on diagnostic imaging of breast: Secondary | ICD-10-CM | POA: Diagnosis present

## 2014-11-11 DIAGNOSIS — N6489 Other specified disorders of breast: Secondary | ICD-10-CM | POA: Diagnosis present

## 2014-11-11 DIAGNOSIS — R590 Localized enlarged lymph nodes: Secondary | ICD-10-CM | POA: Insufficient documentation

## 2014-11-11 DIAGNOSIS — N63 Unspecified lump in breast: Secondary | ICD-10-CM | POA: Diagnosis present

## 2014-11-16 ENCOUNTER — Telehealth: Payer: Self-pay | Admitting: Internal Medicine

## 2014-11-16 ENCOUNTER — Encounter: Payer: Self-pay | Admitting: *Deleted

## 2014-11-16 NOTE — Telephone Encounter (Signed)
colo guard test was negative  Please abstract and notify patient.

## 2014-11-16 NOTE — Telephone Encounter (Signed)
Letter mailed

## 2014-12-01 ENCOUNTER — Encounter: Payer: Self-pay | Admitting: Internal Medicine

## 2014-12-07 ENCOUNTER — Other Ambulatory Visit (INDEPENDENT_AMBULATORY_CARE_PROVIDER_SITE_OTHER): Payer: 59

## 2014-12-07 DIAGNOSIS — E559 Vitamin D deficiency, unspecified: Secondary | ICD-10-CM | POA: Diagnosis not present

## 2014-12-07 DIAGNOSIS — R740 Nonspecific elevation of levels of transaminase and lactic acid dehydrogenase [LDH]: Secondary | ICD-10-CM

## 2014-12-07 DIAGNOSIS — Z1159 Encounter for screening for other viral diseases: Secondary | ICD-10-CM

## 2014-12-07 DIAGNOSIS — R7401 Elevation of levels of liver transaminase levels: Secondary | ICD-10-CM

## 2014-12-07 LAB — HEPATIC FUNCTION PANEL
ALBUMIN: 4.3 g/dL (ref 3.5–5.2)
ALT: 36 U/L — ABNORMAL HIGH (ref 0–35)
AST: 37 U/L (ref 0–37)
Alkaline Phosphatase: 113 U/L (ref 39–117)
Bilirubin, Direct: 0 mg/dL (ref 0.0–0.3)
Total Bilirubin: 0.4 mg/dL (ref 0.2–1.2)
Total Protein: 7.6 g/dL (ref 6.0–8.3)

## 2014-12-07 LAB — FERRITIN: Ferritin: 34.6 ng/mL (ref 10.0–291.0)

## 2014-12-07 LAB — VITAMIN D 25 HYDROXY (VIT D DEFICIENCY, FRACTURES): VITD: 27.98 ng/mL — ABNORMAL LOW (ref 30.00–100.00)

## 2014-12-08 LAB — HEPATITIS C ANTIBODY
HCV Ab: NEGATIVE
HCV Ab: NEGATIVE

## 2014-12-08 LAB — ANA: Anti Nuclear Antibody(ANA): NEGATIVE

## 2014-12-08 LAB — ANTI-SMITH ANTIBODY: ENA SM AB SER-ACNC: NEGATIVE

## 2014-12-08 LAB — HEPATITIS B SURFACE ANTIBODY,QUALITATIVE: Hep B S Ab: POSITIVE — AB

## 2014-12-08 LAB — IRON AND TIBC
%SAT: 26 % (ref 20–55)
Iron: 95 ug/dL (ref 42–145)
TIBC: 367 ug/dL (ref 250–470)
UIBC: 272 ug/dL (ref 125–400)

## 2014-12-08 LAB — HEPATITIS B SURFACE ANTIGEN: HEP B S AG: NEGATIVE

## 2014-12-08 LAB — HEPATITIS B CORE ANTIBODY, TOTAL: Hep B Core Total Ab: NONREACTIVE

## 2014-12-11 ENCOUNTER — Encounter: Payer: Self-pay | Admitting: Internal Medicine

## 2014-12-11 DIAGNOSIS — R748 Abnormal levels of other serum enzymes: Secondary | ICD-10-CM

## 2014-12-18 ENCOUNTER — Ambulatory Visit: Payer: 59

## 2014-12-25 ENCOUNTER — Encounter: Payer: Self-pay | Admitting: Internal Medicine

## 2014-12-25 ENCOUNTER — Ambulatory Visit
Admission: RE | Admit: 2014-12-25 | Discharge: 2014-12-25 | Disposition: A | Discharge status: Home / Self Care | Payer: 59 | Source: Ambulatory Visit | Attending: Internal Medicine | Admitting: Internal Medicine

## 2014-12-25 DIAGNOSIS — K769 Liver disease, unspecified: Secondary | ICD-10-CM | POA: Diagnosis not present

## 2014-12-25 DIAGNOSIS — K8020 Calculus of gallbladder without cholecystitis without obstruction: Secondary | ICD-10-CM | POA: Insufficient documentation

## 2014-12-25 DIAGNOSIS — R945 Abnormal results of liver function studies: Secondary | ICD-10-CM | POA: Diagnosis not present

## 2014-12-25 DIAGNOSIS — R748 Abnormal levels of other serum enzymes: Secondary | ICD-10-CM

## 2015-03-30 ENCOUNTER — Encounter: Payer: Self-pay | Admitting: Internal Medicine

## 2015-05-26 ENCOUNTER — Encounter: Payer: Self-pay | Admitting: Internal Medicine

## 2015-05-27 NOTE — Telephone Encounter (Signed)
Please advise as to coding for visit.

## 2015-05-27 NOTE — Telephone Encounter (Signed)
This must be referring to your mammogram. Can you submit the paperwork to our office manager, Janna Arch?  Regards,   Duncan Dull, MD

## 2016-03-20 IMAGING — US US BREAST LTD UNI LEFT INC AXILLA
1 series · 2 of 2 positions shown · non-contrast
Comparison: Previous exam(s).

CLINICAL DATA: Further evaluation of possible mass lower outer
quadrant left breast and medial right breast

EXAM:
DIGITAL DIAGNOSTIC BILATERAL MAMMOGRAM WITH CAD
ULTRASOUND LEFT BREAST

[Series 1: us breast ltd uni left inc axilla · 0.07mm/px · 2 of 2 slices shown]
[im 1/2]
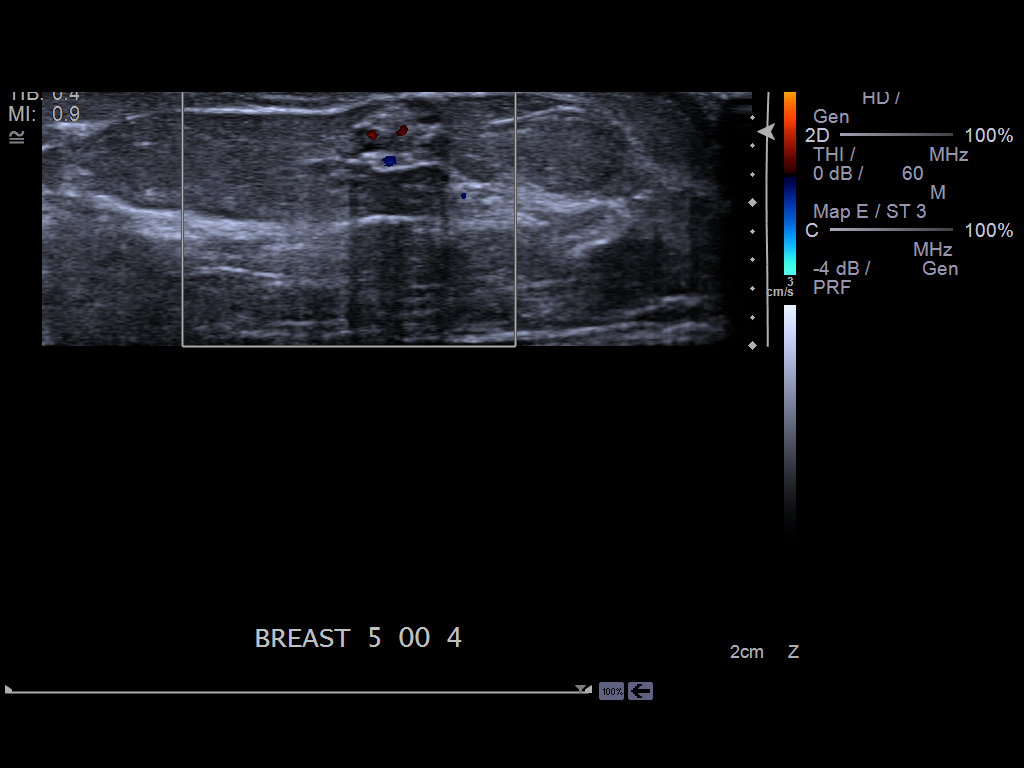
[im 2/2]
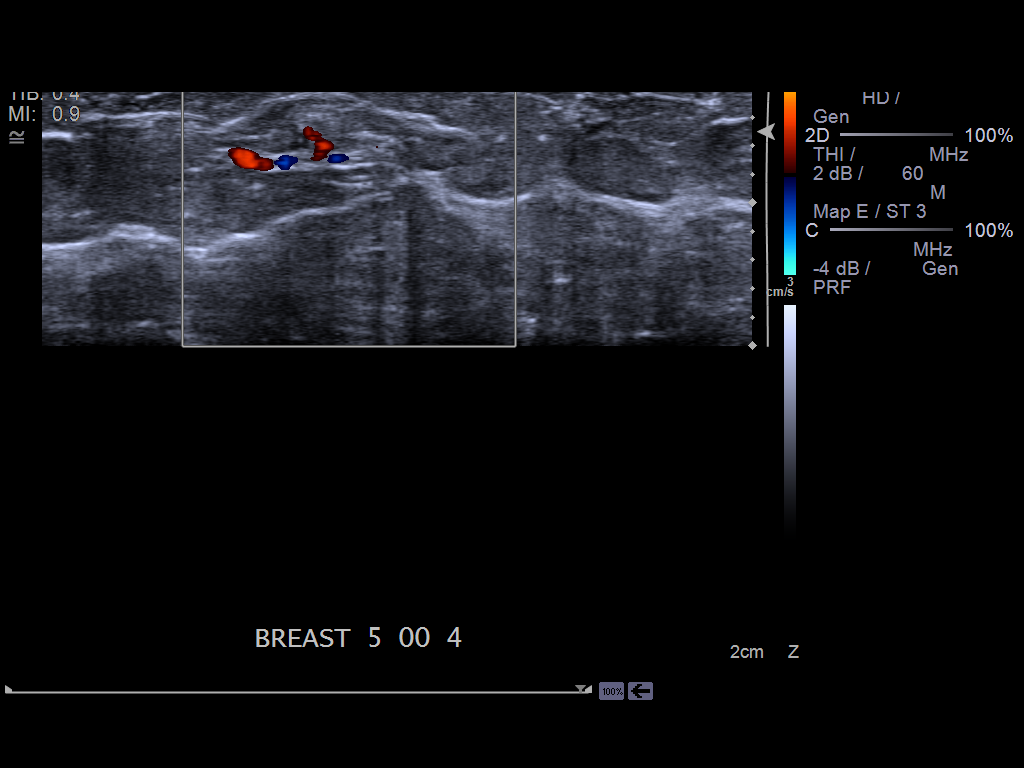

[2 of 2 positions shown; findings below may reference images not displayed]

ACR Breast Density Category b: There are scattered areas of
fibroglandular density.
FINDINGS: No persistent abnormalities on the right.

On the left there is an oval 5 mm mass lower outer quadrant
posteriorly, which appears to be located superficially in which
appears most consistent with benign intramammary lymph node.

Mammographic images were processed with CAD.

On physical exam, there are no palpable abnormalities.

Targeted ultrasound is performed, showing a benign intramammary
lymph node 4 cm from the nipple in the 5 o'clock position of the
left breast. It measures 6 x 4 x 6 mm.
IMPRESSION: Benign intramammary lymph node left breast with no persistent
abnormalities on the right.

RECOMMENDATION:
Screening mammogram in 1 year

I have discussed the findings and recommendations with the patient.
Results were also provided in writing at the conclusion of the
visit. If applicable, a reminder letter will be sent to the patient
regarding the next appointment.

BI-RADS CATEGORY  2: Benign.

## 2019-09-25 ENCOUNTER — Other Ambulatory Visit: Payer: Self-pay | Admitting: Otolaryngology

## 2019-09-25 DIAGNOSIS — H9313 Tinnitus, bilateral: Secondary | ICD-10-CM

## 2019-09-25 DIAGNOSIS — H903 Sensorineural hearing loss, bilateral: Secondary | ICD-10-CM

## 2019-10-08 ENCOUNTER — Other Ambulatory Visit: Payer: Self-pay

## 2019-10-08 ENCOUNTER — Ambulatory Visit
Admission: RE | Admit: 2019-10-08 | Discharge: 2019-10-08 | Disposition: A | Discharge status: Home / Self Care | Payer: 59 | Source: Ambulatory Visit | Attending: Otolaryngology | Admitting: Otolaryngology

## 2019-10-08 DIAGNOSIS — H903 Sensorineural hearing loss, bilateral: Secondary | ICD-10-CM | POA: Diagnosis present

## 2019-10-08 DIAGNOSIS — H9313 Tinnitus, bilateral: Secondary | ICD-10-CM | POA: Diagnosis present

## 2019-10-08 MED ORDER — GADOBUTROL 1 MMOL/ML IV SOLN
7.5000 mL | Freq: Once | INTRAVENOUS | Status: AC | PRN
Start: 2019-10-08 — End: 2019-10-08
  Administered 2019-10-08: 7.5 mL via INTRAVENOUS

## 2019-10-23 DIAGNOSIS — L578 Other skin changes due to chronic exposure to nonionizing radiation: Secondary | ICD-10-CM | POA: Diagnosis not present

## 2020-03-15 ENCOUNTER — Telehealth: Payer: Self-pay | Admitting: Internal Medicine

## 2020-03-15 NOTE — Telephone Encounter (Signed)
No.  Not if she has not tried to make contact.

## 2020-03-15 NOTE — Telephone Encounter (Signed)
Chart review to close colonoscopy gaps patient due for cologuard has not had an appointment with PCP since 2016,

## 2020-03-16 NOTE — Telephone Encounter (Signed)
Tried to reach patient to confirm new PCP.

## 2021-06-16 ENCOUNTER — Encounter: Payer: Self-pay | Admitting: Cardiology

## 2021-06-16 ENCOUNTER — Ambulatory Visit (INDEPENDENT_AMBULATORY_CARE_PROVIDER_SITE_OTHER): Payer: BC Managed Care – PPO | Admitting: Cardiology

## 2021-06-16 ENCOUNTER — Ambulatory Visit (INDEPENDENT_AMBULATORY_CARE_PROVIDER_SITE_OTHER): Payer: BC Managed Care – PPO

## 2021-06-16 ENCOUNTER — Other Ambulatory Visit: Payer: Self-pay

## 2021-06-16 DIAGNOSIS — I493 Ventricular premature depolarization: Secondary | ICD-10-CM

## 2021-06-16 DIAGNOSIS — R001 Bradycardia, unspecified: Secondary | ICD-10-CM

## 2021-06-16 NOTE — Patient Instructions (Signed)
Medication Instructions:   Your physician recommends that you continue on your current medications as directed. Please refer to the Current Medication list given to you today.   *If you need a refill on your cardiac medications before your next appointment, please call your pharmacy*   Lab Work: None ordered  If you have labs (blood work) drawn today and your tests are completely normal, you will receive your results only by: MyChart Message (if you have MyChart) OR A paper copy in the mail If you have any lab test that is abnormal or we need to change your treatment, we will call you to review the results.   Testing/Procedures: Your provider has ordered a heart monitor to wear for 7 days. This will be mailed to your home with instructions on placement. Once you have finished the time frame requested, you will return monitor in box provided.      Follow-Up: At [facility], you and your health needs are our priority.  As part of our continuing mission to provide you with exceptional heart care, we have created designated Provider Care Teams.  These Care Teams include your primary Cardiologist (physician) and Advanced Practice Providers (APPs -  Physician Assistants and Nurse Practitioners) who all work together to provide you with the care you need, when you need it.  We recommend signing up for the patient portal called "MyChart".  Sign up information is provided on this After Visit Summary.  MyChart is used to connect with patients for Virtual Visits (Telemedicine).  Patients are able to view lab/test results, encounter notes, upcoming appointments, etc.  Non-urgent messages can be sent to your provider as well.   To learn more about what you can do with MyChart, go to [email or web address].    Your next appointment:   6-8 week(s)  The format for your next appointment:   In Person  Provider:   You may see Bryan Lemma, MD or one of the following Advanced Practice Providers  on your designated Care Team:   Nicolasa Ducking, NP Eula Listen, PA-C Cadence Fransico Michael, [place]   Other Instructions N/A

## 2021-06-16 NOTE — Progress Notes (Signed)
Primary Care Provider: Crecencio Mc, MD [facility] Cardiologist: None Electrophysiologist: None  Clinic Note: Chief Complaint  Patient presents with   Other    Pt c/o low HR flagged by pt's Iwatch. Meds reviewed verbally with pt.   ===================================  ASSESSMENT/PLAN   Problem List Items Addressed This Visit       Cardiology Problems   PVC (premature ventricular contraction)    PVCs noted along with bradycardia the patient has pretty slow heart rates at night.  Plan: 7-day Zio patch monitor to determine if this is simply sinus bradycardia versus pauses or AV block.  With PVCs, need to make sure that there is also no junctional escape rhythms.        Relevant Orders   EKG 12-Lead   LONG TERM MONITOR (3-14 DAYS)     Other   Bradycardia on ECG (Chronic)    Bradycardia mostly at night.  Need to exclude pauses or AV block.   Plan: Check Zio patch monitor for 7 days.        Relevant Orders   EKG 12-Lead   LONG TERM MONITOR (3-14 DAYS)    ===================================  HPI:    Wendy Gonzales is a 61 y.o. female who is being seen today for the evaluation of SLOW HEART RATE AND ABNORMAL HEARTBEATS.  She is self-referred.  Wendy Gonzales contacted the office for evaluation of slow heart rates noted on her Apple Watch.  Recent Hospitalizations: None  Reviewed  CV studies:    The following studies were reviewed today: (if available, images/films reviewed: From Epic Chart or Care Everywhere) Apple Watch recording of heart rate:  May 25-3:06 AM: 44 to 48 bpm;  January 24 1:21 AM 39-41 bpm; 1:32 AM 39-39; 1:57 AM 39-40 bpm; 3:17 AM 39 and 40 bpm; 3:44 AM 39-42 bpm, January 26 3:32 AM 38-31 bpm.   Interval History:   Wendy Gonzales presents here today just concerned because of her watch alarm going off of several times showing heart rate less than 40 bpm.  Currently her heart rate is 56 bpm on vital signs, but 79 bpm on EKG. She  says that during the day she does not have any alarm to go off.  She says she feels a few skipped beats here and there but not really elevated bothers her.  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  (She works as a Radio producer.).  She denies any lightheadedness dizziness or syncope/near syncope.  She denies any symptoms of chronotropic incompetence such as exertional dyspnea or exercise intolerance.  She denies any daytime somnolence or fatigue or lack of adequate sleep.  Basically she says that she goes to sleep, and sometimes is woken up by her Apple Watch alarm going off telling her that her heart rate is too slow.  She has no symptoms that are concerning.  No significant family history of arrhythmias or pacemaker requirement..  CV Review of Symptoms (Summary) Cardiovascular ROS: no chest pain or dyspnea on exertion positive for - irregular heartbeat and palpitations negative for - edema, orthopnea, paroxysmal nocturnal dyspnea, rapid heart rate, shortness of breath, or lightheadedness, dizziness or wooziness, syncope/near syncope or TIA/amaurosis fugax, claudication  REVIEWED OF SYSTEMS   Review of Systems  Constitutional:  Positive for weight loss (Since 2016, she has lost weight, but none recently.). Negative for malaise/fatigue.  HENT:  Negative for congestion and nosebleeds.   Respiratory:  Negative for  cough and shortness of breath.   Cardiovascular:        Per HPI  Gastrointestinal:  Negative for blood in stool and melena.  Genitourinary:  Negative for dysuria and hematuria.  Musculoskeletal:  Negative for joint pain.  Neurological:  Negative for dizziness, tingling, focal weakness, weakness and headaches.  Psychiatric/Behavioral:  Negative for depression and memory loss. The patient is nervous/anxious. The patient does not have insomnia (Only has issues sleeping when her alarm goes off.  Does not feel tired and fatigued on waking up.  Feels  poorly rested.).    I have reviewed and (if needed) personally updated the patient's problem list, medications, allergies, past medical and surgical history, social and family history.   PAST MEDICAL HISTORY   Past Medical History:  Diagnosis Date   Tendonitis of ankle, left     PAST SURGICAL HISTORY   Past Surgical History:  Procedure Laterality Date   AUGMENTATION MAMMAPLASTY Bilateral    + 20 years saline     There is no immunization history on file for this patient.  MEDICATIONS/ALLERGIES   No outpatient medications have been marked as taking for the 06/16/21 encounter (Office Visit) with Leonie Man, MD.    Allergies  Allergen Reactions   Sulfa Antibiotics Rash    SOCIAL HISTORY/FAMILY HISTORY   Reviewed in Epic:   Social History   Tobacco Use   Smoking status: Never   Smokeless tobacco: Never  Substance Use Topics   Alcohol use: Not Currently    Alcohol/week: 4.0 standard drinks    Types: 4 Glasses of wine per week    Comment: Socially   Drug use: No   Social History   Social History Narrative   Not on file   Family History  Problem Relation Age of Onset   Cataracts Mother    Hypertension Mother    Hypertension Father     OBJCTIVE -PE, EKG, labs   Wt Readings from Last 3 Encounters:  06/16/21 185 lb 2 oz (84 kg)  10/28/14 199 lb 6 oz (90.4 kg)  08/06/13 192 lb (87.1 kg)    Physical Exam: BP 120/62 (BP Location: Right Arm, Patient Position: Sitting, Cuff Size: Normal)    Pulse 79    Ht 5\' 6"  (1.676 m)    Wt 185 lb 2 oz (84 kg)    LMP 03/25/2012    SpO2 99%    BMI 29.88 kg/m  Physical Exam Vitals reviewed.  Constitutional:      General: She is not in acute distress.    Appearance: Normal appearance. She is obese. She is not ill-appearing or toxic-appearing.     Comments: Essentially obese (BMI almost 30).   Otherwise healthy-appearing.  Well-nourished, and well-groomed.  HENT:     Head: Normocephalic and atraumatic.  Eyes:      Extraocular Movements: Extraocular movements intact.     Conjunctiva/sclera: Conjunctivae normal.     Pupils: Pupils are equal, round, and reactive to light.  Neck:     Vascular: Normal carotid pulses. No carotid bruit, hepatojugular reflux or JVD.  Cardiovascular:     Rate and Rhythm: Normal rate and regular rhythm. Occasional Extrasystoles are present.    Pulses: Normal pulses.     Heart sounds: Normal heart sounds, S1 normal and S2 normal. No murmur heard.   No friction rub. No gallop.  Pulmonary:     Effort: Pulmonary effort is normal. No respiratory distress.     Breath sounds: Normal breath sounds. No  wheezing, rhonchi or rales.  Chest:     Chest wall: No tenderness.  Abdominal:     General: Abdomen is flat. Bowel sounds are normal. There is no distension.     Palpations: Abdomen is soft. There is no mass (No HSM or).     Tenderness: There is no abdominal tenderness.  Musculoskeletal:        General: No swelling. Normal range of motion.     Cervical back: Normal range of motion and neck supple.  Skin:    General: Skin is warm and dry.  Neurological:     General: No focal deficit present.     Mental Status: She is alert and oriented to person, place, and time.     Gait: Gait normal.  Psychiatric:        Mood and Affect: Mood normal.        Behavior: Behavior normal.        Thought Content: Thought content normal.        Judgment: Judgment normal.     Adult ECG Report  Rate: 79;  Rhythm: normal sinus rhythm, sinus arrhythmia, premature ventricular contractions (PVC), and normal axis, intervals and durations. ;   Narrative Interpretation: Stable  Recent Labs: No recent labs Lab Results  Component Value Date   CHOL 225 (H) 11/05/2014   HDL 65.30 11/05/2014   LDLCALC 145 (H) 11/05/2014   LDLDIRECT 165.6 08/13/2012   TRIG 74.0 11/05/2014   CHOLHDL 3 11/05/2014   Lab Results  Component Value Date   CREATININE 0.78 11/05/2014   BUN 11 11/05/2014   NA 139 11/05/2014    K 4.2 11/05/2014   CL 104 11/05/2014   CO2 28 11/05/2014   CBC Latest Ref Rng & Units 11/05/2014 08/13/2012  WBC 4.0 - 10.5 K/uL 6.9 6.2  Hemoglobin 12.0 - 15.0 g/dL 14.1 13.2  Hematocrit 36.0 - 46.0 % 41.6 38.8  Platelets 150.0 - 400.0 K/uL 316.0 307.0    No results found for: HGBA1C Lab Results  Component Value Date   TSH 0.91 11/05/2014    ==================================================  COVID-19 Education: The signs and symptoms of COVID-19 were discussed with the patient and how to seek care for testing (follow up with PCP or arrange E-visit).    I spent a total of 23 minutes with the patient spent in direct patient consultation.  Additional time spent with chart review  / charting (studies, outside notes, etc): 18 min Total Time: 41 min  Current medicines are reviewed at length with the patient today.  (+/- concerns) none  This visit occurred during the SARS-CoV-2 public health emergency.  Safety protocols were in place, including screening questions prior to the visit, additional usage of staff PPE, and extensive cleaning of exam room while observing appropriate contact time as indicated for disinfecting solutions.  Notice: This dictation was prepared with Dragon dictation along with smart phrase technology. Any transcriptional errors that result from this process are unintentional and may not be corrected upon review.   Studies Ordered:  Orders Placed This Encounter  Procedures   LONG TERM MONITOR (3-14 DAYS)   EKG 12-Lead    Patient Instructions / Medication Changes & Studies & Tests Ordered   Patient Instructions  Medication Instructions:   Your physician recommends that you continue on your current medications as directed. Please refer to the Current Medication list given to you today.   *If you need a refill on your cardiac medications before your next appointment, please call your pharmacy*  Lab Work: None ordered  If you have labs (blood work)  drawn today and your tests are completely normal, you will receive your results only by: [place] (if you have MyChart) OR A paper copy in the mail If you have any lab test that is abnormal or we need to change your treatment, we will call you to review the results.   Testing/Procedures: Your provider has ordered a heart monitor to wear for 7 days. This will be mailed to your home with instructions on placement. Once you have finished the time frame requested, you will return monitor in box provided.    Follow-Up: At [facility], you and your health needs are our priority.  As part of our continuing mission to provide you with exceptional heart care, we have created designated Provider Care Teams.  These Care Teams include your primary Cardiologist (physician) and Advanced Practice Providers (APPs -  Physician Assistants and Nurse Practitioners) who all work together to provide you with the care you need, when you need it.  We recommend signing up for the patient portal called "MyChart".  Sign up information is provided on this After Visit Summary.  MyChart is used to connect with patients for Virtual Visits (Telemedicine).  Patients are able to view lab/test results, encounter notes, upcoming appointments, etc.  Non-urgent messages can be sent to your provider as well.   To learn more about what you can do with MyChart, go to [email or web address].    Your next appointment:   6-8 week(s)  The format for your next appointment:   In Person  Provider:   You may see Glenetta Hew, MD or one of the following Advanced Practice Providers on your designated Care Team:   Murray Hodgkins, NP Christell Faith, PA-C Cadence Kathlen Mody, [place]   Other Instructions N/A    Glenetta Hew, M.D., M.S. Interventional Cardiologist   Pager # [phone] Phone # [phone] [address] [place], [place] [address]   Thank you for choosing [facility]!!

## 2021-06-19 ENCOUNTER — Encounter: Payer: Self-pay | Admitting: Cardiology

## 2021-06-19 NOTE — Assessment & Plan Note (Addendum)
Bradycardia mostly at night.  Need to exclude pauses or AV block.   Plan: Check Zio patch monitor for 7 days.

## 2021-06-19 NOTE — Assessment & Plan Note (Signed)
PVCs noted along with bradycardia the patient has pretty slow heart rates at night.  Plan: 7-day Zio patch monitor to determine if this is simply sinus bradycardia versus pauses or AV block.  With PVCs, need to make sure that there is also no junctional escape rhythms.

## 2021-06-20 DIAGNOSIS — I493 Ventricular premature depolarization: Secondary | ICD-10-CM

## 2021-06-20 DIAGNOSIS — R001 Bradycardia, unspecified: Secondary | ICD-10-CM

## 2021-07-06 ENCOUNTER — Encounter: Payer: Self-pay | Admitting: Cardiology

## 2021-07-06 ENCOUNTER — Telehealth: Payer: Self-pay | Admitting: Cardiology

## 2021-07-06 DIAGNOSIS — I4729 Other ventricular tachycardia: Secondary | ICD-10-CM | POA: Insufficient documentation

## 2021-07-06 NOTE — Telephone Encounter (Addendum)
? ?  Cardiac Monitor Alert ? ?Date of alert:  07/06/2021  ? ?Patient Name: Wendy Gonzales  ?DOB: 1960/05/17  ?MRN: [id number]  ? ?[facility] Cardiologist: Dr. Herbie Baltimore ?[facility] EP:  None   ? ?Monitor Information: ?Long Term Monitor ZioXT ?Reason:  Bradycardia/PVCs ?Ordering provider:  Dr. Herbie Baltimore ?  ?Alert ?Ventricular Tachycardia ?This is the 1st alert for this rhythm.  ? ?Next Cardiology Appointment  ? Date:  08/11/21 @ 4 pm  Provider:  Dr. Herbie Baltimore ? ?Patient not contacted  ? ?Other: ?Returned the call to [place] with [employer] ? ?Patient had 2,830 episodes of VTach ?Longest episode: 06/25/21 @ 10:09 pm ?Longest duration: 3.4 sec 6 beats ?Average heartrate: 149 bpm  ?Available for review on Page 5 strip 4 of the report ? ?Report is available for review in Epic ? ?Jarvis Newcomer, RN  ?07/06/2021 2:02 PM  ? ?

## 2021-07-06 NOTE — Telephone Encounter (Signed)
?  1. Is this related to a heart monitor you are wearing?  (If the patient says no, please ask     if they are caling about ICD/pacemaker.) no longer wearing zio  ? ?2. What is your issue?? Abnormal report ok to return call non urgent at this time  (If the patient is calling for results of the heart monitor this     message should be sent to nurse.) ? ? ?Please route to covering RN/CMA/RMA for results. Route to monitor technicians or your monitor tech representative for your site for any technical concerns ? ? ?Reference # ? ?[id number] ? ?

## 2021-07-07 ENCOUNTER — Telehealth: Payer: Self-pay | Admitting: Emergency Medicine

## 2021-07-07 NOTE — Telephone Encounter (Signed)
Spoke with patient. Went over results with patient.  ? ?Patient asked if caffeine could have any effect on her PVC burden. Reports that she has 3-4 large mochas a day. Told patient that caffeine would absolutely have an effect on PVC burden. Patient stated that she would work on weaning down her caffeine intake.  ? ?Patient understands that she will be scheduled for echo.  ? ?Message will be sent to scheduling.  ?

## 2021-07-07 NOTE — Telephone Encounter (Signed)
Called patient, to go over results and recommendations. No answer, lmtcb.  ? ?Order for echo placed.  ?

## 2021-07-07 NOTE — Telephone Encounter (Signed)
LMOV to schedule  

## 2021-07-07 NOTE — Telephone Encounter (Signed)
-----   Message from Marykay Lex, MD sent at 07/06/2021  9:35 PM EDT ----- ?Monitor results:Narrative & Impression ?? ?? Predominantly sinus rhythm heart rate range 47 to 148 bpm. Average 81 bpm. ?? Frequent PVCs (15.6%. Rare couplets and triplets. Ventricular trigeminy noted for 19 minutes. Short bursts of bigeminy noted. ?? Multiple (2830) episodes of ventricular runs (3-6 beats): Fastest was 5 beats at a rate of 184 bpm, longest 6 beats at a rate of 149 bpm. ?? No prolonged true episodes of nonsustained ventricular tachycardia ?? Rare PACs with couplets and triplets. I again ?? No significant bradycardia. ?? Symptoms noted with sinus rhythm and PVCs mostly trigeminy. None of the short bursts of ventricular runs were noted as either patient triggered or on the diary.. ?  ?Significant percentage of PVCs noted with short bursts of ventricular runs. ?? ?Interestingly, no real significant bradycardia episodes noted. ?? ?With this high of a PVC burden, we do need to do some further studies including checking 2D echocardiogram as well as an ischemic evaluation.  I would like to at least get the echocardiogram before I see her back. ?? ??(PVCs - premature ventricular contractions; PACs premature atrial contractions; bigeminy - every 2nd beat, trigeminy - every 3rd beat). ? ?? ?Bryan Lemma, MD ?? ? ?Dx for Echo: PVCs, NSVT ?

## 2021-07-29 ENCOUNTER — Ambulatory Visit (INDEPENDENT_AMBULATORY_CARE_PROVIDER_SITE_OTHER): Payer: BC Managed Care – PPO

## 2021-07-29 DIAGNOSIS — I493 Ventricular premature depolarization: Secondary | ICD-10-CM

## 2021-07-29 LAB — ECHOCARDIOGRAM COMPLETE
AR max vel: 1.94 cm2
AV Area VTI: 2.1 cm2
AV Area mean vel: 1.8 cm2
AV Mean grad: 6 mmHg
AV Peak grad: 10.1 mmHg
Ao pk vel: 1.59 m/s
Area-P 1/2: 4.44 cm2
Calc EF: 64.7 %
S' Lateral: 2.8 cm
Single Plane A2C EF: 72.2 %
Single Plane A4C EF: 56 %

## 2021-08-11 ENCOUNTER — Ambulatory Visit (INDEPENDENT_AMBULATORY_CARE_PROVIDER_SITE_OTHER): Payer: BC Managed Care – PPO | Admitting: Cardiology

## 2021-08-11 ENCOUNTER — Encounter: Payer: Self-pay | Admitting: Cardiology

## 2021-08-11 VITALS — BP 124/70 | HR 83 | Ht 66.0 in | Wt 180.1 lb

## 2021-08-11 DIAGNOSIS — I4729 Other ventricular tachycardia: Secondary | ICD-10-CM

## 2021-08-11 DIAGNOSIS — R001 Bradycardia, unspecified: Secondary | ICD-10-CM | POA: Diagnosis not present

## 2021-08-11 DIAGNOSIS — I493 Ventricular premature depolarization: Secondary | ICD-10-CM

## 2021-08-11 DIAGNOSIS — R072 Precordial pain: Secondary | ICD-10-CM | POA: Diagnosis not present

## 2021-08-11 MED ORDER — METOPROLOL SUCCINATE ER 25 MG PO TB24: 25.0000 mg | ORAL_TABLET | Freq: Every day | ORAL | 3 refills | Status: AC

## 2021-08-11 MED ORDER — METOPROLOL TARTRATE 100 MG PO TABS: 100.0000 mg | ORAL_TABLET | Freq: Once | ORAL | 0 refills | Status: AC

## 2021-08-11 NOTE — Progress Notes (Signed)
? ?Primary Care Provider: Sherlene Shamsullo, Teresa L, MD ?Cardiologist: None ?Electrophysiologist: None ? ?Clinic Note: ?Chief Complaint  ?Patient presents with  ? Follow-up  ?  6-8 wk f/u zio no complaints today. Meds reviewed verbally with pt  ? Palpitations  ?  Rarely notes skipped beats, but the monitor showed over 16% PVCs with significant short bursts of NSVT.  ? ?=================================== ? ?ASSESSMENT/PLAN  ? ?Problem List Items Addressed This Visit   ? ?  ? Cardiology Problems  ? Frequent unifocal PVCs - Primary (Chronic)  ?  Significant PVCs noted on EKG, but about 16% PVCs noted on monitor with extensive PVC couplets triplets and runs between 4-6 beats.  Thankfully, EF still normal on echo with no structural abnormalities noted. ? ?Need to exclude ischemic CAD but probably also will need cardiac MRI-just not sure how accurate these readings will be with her breast implants. ? ?We will start with Coronary CTA for ischemic evaluation, and start low-dose Toprol. ? ?We will also put in referral to EP based on the significant burden of both PVCs and NSVT.  Question possibility of ablation or antiarrhythmic management-she would prefer to avoid long-term medications. ? ?  ?  ? Relevant Medications  ? metoprolol succinate (TOPROL XL) 25 MG 24 hr tablet  ? Other Relevant Orders  ? Ambulatory referral to Cardiac Electrophysiology  ? NSVT (nonsustained ventricular tachycardia) (HCC) (Chronic)  ?  Frequent short bursts of 4-6 beat runs of VT.  Completely asymptomatic, but in the setting of 50.6% PVCs, need to exclude ischemic etiology but also probably cardiomyopathy.  We will start with Coronary CTA as noted.  She is having some chest discomfort that may just be the PVCs for short bursts of VT since they are happening with such frequency. ? ?Plan: Coronary CTA, anticipate probably CMR but need to make sure that this would be feasible with her breast implants.  Would defer to EP. ? ?Refer to EP. ? ?  ?  ? Relevant  Medications  ? metoprolol succinate (TOPROL XL) 25 MG 24 hr tablet  ?  ? Other  ? Precordial pain  ?  She feels a little weird sensation in her chest every now and then about what if this is her baseline noticing the PVCs or short bursts of VT.  However with this much for burden of PVCs and VT.  He did exclude ischemia. ? ?Plan: Coronary CTA ? ?  ?  ? Relevant Orders  ? Basic Metabolic Panel (BMET)  ? CT CORONARY MORPH W/CTA COR W/SCORE W/CA W/CM &/OR WO/CM  ? Bradycardia on ECG (Chronic)  ?  She does have bradycardia noted on EKGs and that was the reason why she came.  Hence I am a little leery of pushing too far with beta-blockers.  We will start low-dose Toprol 12.5 mg daily with plans to titrate up further to 25 mg if tolerated after 1 month. ? ?She really does not want take medications, but I would like to see if we could theoretically not down the PVC burden and potentially reassess while on beta-blocker. ? ?  ?  ? ? ?=================================== ? ?HPI:   ? ?Wendy MayotteLisa G Gonzales is a 61 y.o. female with a PMH below who presents today for 733-month follow-up after Zio patch monitor and echocardiogram.. ?She is being seen today for follow-up at the request of Sherlene Shamsullo, Teresa L, MD. ? ?Wendy MayotteLisa G Morrish was initially seen on 06/16/2021 with concerns about her watch alarm going off sometimes at  night showing slow heart rates less than 40 bpm.  On exam her heart was 56 but 79 bpm on EKG.  These alarms never occurred in the day.  She does occasionally feels skipped beats here and there but nothing bothering her.  Often occur with rest, not with activity.  Not with teaching.  (She works as a Chart[employer] loss adjuster.  No lightheadedness, dizziness or wooziness, no syncope or near syncope.  No evidence of chronotropic incompetence/exercise intolerance or exertional dyspnea.  No notable fatigue or signs of OSA. ?We decided to start with a Zio patch monitor that showed significant PVC burden, and subsequently ordered an echocardiogram  placed follow-up to discuss further management. ? ?Recent Hospitalizations: None ? ?Reviewed  CV studies:   ? ?The following studies were reviewed today: (if available, images/films reviewed: From Epic Chart or Care Everywhere) ?Zio March 2023: ?? Predominantly sinus rhythm heart rate range 47 to 148 bpm. Average 81 bpm. ?? Frequent PVCs (15.6%. Rare couplets and triplets. Ventricular trigeminy noted for 19 minutes. Short bursts of bigeminy noted. ?? Multiple (2830) episodes of ventricular runs (3-6 beats): Fastest was 5 beats at a rate of 184 bpm, longest 6 beats at a rate of 149 bpm. ?? No prolonged true episodes of nonsustained ventricular tachycardia ?? Rare PACs with couplets and triplets. I again ?? No significant bradycardia. ?? Symptoms noted with sinus rhythm and PVCs mostly trigeminy. None of the short bursts of ventricular runs were noted as either patient triggered or on the diary.. ?  ?Echo 07/29/2021: Suboptimal windows due to breast implants.  Normal LV size and function.  EF 55 to 60%.  No RWMA.  GR 1 DD.  Normal longitudinal strain.  Normal RV size and function.  Normal PAP.  Mild MR, otherwise normal atrial sizes and other valves were normal. ? ?Interval History:  ? ?[name] presents here today for follow-up to discuss results of her monitor, also echocardiogram.  She still remains pretty asymptomatic and has not had really that much in the way of any symptoms.  She still has the watch going off for low heart rates but changed the sensitivity of it.  She is not having any dizziness or lightheadedness.  No syncope or near syncope.  She does note feeling a few skipped beats here and there but still really when she is settling down at night.  Although much try to go to sleep.  Nothing prolonged nothing overly worrisome to her.  Otherwise asymptomatic from cardiac standpoint. ? ?CV Review of Symptoms (Summary) ?Cardiovascular ROS: positive for - irregular heartbeat, palpitations, and monitor  showing slow heart rates ?negative for - chest pain, dyspnea on exertion, edema, orthopnea, paroxysmal nocturnal dyspnea, rapid heart rate, shortness of breath, or syncope or near syncope, TIA/amaurosis fugax or claudication ? ?REVIEWED OF SYSTEMS  ? ?Review of Systems  ?Constitutional:  Negative for malaise/fatigue and weight loss.  ?HENT:  Negative for nosebleeds.   ?Respiratory:  Negative for cough and shortness of breath.   ?Cardiovascular:  Positive for chest pain (Occasionally notes tugging and irregular symptoms in her chest-not sure if this is PVCs.).  ?     Per HPI  ?Gastrointestinal:  Negative for blood in stool and melena.  ?Genitourinary:  Negative for hematuria.  ?Musculoskeletal:  Negative for joint pain.  ?Neurological:  Negative for dizziness, loss of consciousness, weakness and headaches.  ?Psychiatric/Behavioral: Negative.    ? ?I have reviewed and (if needed) personally updated the patient's problem list, medications, allergies, past medical  and surgical history, social and family history.  ? ?PAST MEDICAL HISTORY  ? ?Past Medical History:  ?Diagnosis Date  ? Frequent PVCs   ? 15.6% PVCs with rare couplets and triplets.  Also bigeminy/trigeminy noted.  Multiple (over 2800 short runs of NSVT-3-6 beats.  Fastest 5 beats at 184 bpm, longest 6 beats at 149 bpm.>  Completely asymptomatic.-  ? Sinus bradycardia by electrocardiography   ? Zio patch monitor showed heart rate ranged from 47 148 bpm.  Average 81 bpm.  Low heart rate ranges were oftentimes at night, but also complicated by frequent PVCs with bigeminy and trigeminy.  ? Tendonitis of ankle, left   ? ? ?PAST SURGICAL HISTORY  ? ?Past Surgical History:  ?Procedure Laterality Date  ? AUGMENTATION MAMMAPLASTY Bilateral   ? + 20 years saline  ? TRANSTHORACIC ECHOCARDIOGRAM    ? 08/18/2021: Suboptimal windows due to breast implants.  Normal LV size and function.  EF 55 to 60%.  No RWMA.  G1 DD.  Normal RV function.  Normal pressures.  Otherwise  normal valves.  ? ? ?There is no immunization history on file for this patient. ? ?MEDICATIONS/ALLERGIES  ? ?No meds ? ?Allergies  ?Allergen Reactions  ? Sulfa Antibiotics Rash  ? ? ?SOCIAL HISTORY/FAMILY HISTORY  ?

## 2021-08-11 NOTE — Patient Instructions (Signed)
Medication Instructions:  ?Your physician has recommended you make the following change in your medication:  ? ?START Toprol-XL (metoprolol succinate) 25 mg daily. For the first 8 days, only take 1/2 tablet.  ? ?*If you need a refill on your cardiac medications before your next appointment, please call your pharmacy* ? ? ?Lab Work: ? ?Please go to the [place] to have a BMP drawn. ? ?Nature conservation officer at [facility] ?1st desk on the right to check in, past the screening table ?Lab hours: Monday- Friday (7:30 am- 5:30 pm)  ? ?If you have labs (blood work) drawn today and your tests are completely normal, you will receive your results only by: ?MyChart Message (if you have MyChart) OR ?A paper copy in the mail ?If you have any lab test that is abnormal or we need to change your treatment, we will call you to review the results. ? ? ?Testing/Procedures: ?Your cardiac CT will be scheduled at: ? ?[facility] ?[address] ?[place], [place] [address] ?[phone] ? ?Please arrive 15 mins early for check-in and test prep. ? ? ? ?Please follow these instructions carefully (unless otherwise directed): ? ?Hold all erectile dysfunction medications at least 3 days (72 hrs) prior to test. ? ?On the Night Before the Test: ?Be sure to Drink plenty of water. ?Do not consume any caffeinated/decaffeinated beverages or chocolate 12 hours prior to your test. ? ?On the Day of the Test: ?Drink plenty of water until 1 hour prior to the test. ?Do not eat any food 4 hours prior to the test. ?You may take your regular medications prior to the test.  ?Take metoprolol (Lopressor) two hours prior to test. ?FEMALES- please wear underwire-free bra if available, avoid dresses & tight clothing  ? ?     ?After the Test: ?Drink plenty of water. ?After receiving IV contrast, you may experience a mild flushed feeling. This is normal. ?On occasion, you may experience a mild rash up to 24 hours after the  test. This is not dangerous. If this occurs, you can take Benadryl 25 mg and increase your fluid intake. ?If you experience trouble breathing, this can be serious. If it is severe call 911 IMMEDIATELY. If it is mild, please call our office. ? ? ?Please allow 2-4 weeks for scheduling of routine cardiac CTs. Some insurance companies require a pre-authorization which may delay scheduling of this test.  ? ?For non-scheduling related questions, please contact the cardiac imaging nurse navigator should you have any questions/concerns: ?Marchia Bond, Cardiac Imaging Nurse Navigator ?Gordy Clement, Cardiac Imaging Nurse Navigator ?[facility] ?Direct Office Dial: [phone]  ? ?For scheduling needs, including cancellations and rescheduling, please call [place], [phone].   ? ?You have been referred to EP.  ? ?Follow-Up: ?At [facility], you and your health needs are our priority.  As part of our continuing mission to provide you with exceptional heart care, we have created designated Provider Care Teams.  These Care Teams include your primary Cardiologist (physician) and Advanced Practice Providers (APPs -  Physician Assistants and Nurse Practitioners) who all work together to provide you with the care you need, when you need it. ? ?We recommend signing up for the patient portal called "MyChart".  Sign up information is provided on this After Visit Summary.  MyChart is used to connect with patients for Virtual Visits (Telemedicine).  Patients are able to view lab/test results, encounter notes, upcoming appointments, etc.  Non-urgent messages  can be sent to your provider as well.   ?To learn more about what you can do with MyChart, go to [email or web address].   ? ?Your next appointment:   ?After EP appointment ? ?The format for your next appointment:   ?In Person ? ?Provider:   ?You may see Glenetta Hew, MD or one of the following Advanced Practice Providers on your designated Care  Team:   ?Murray Hodgkins, NP ?Christell Faith, PA-C ?Cadence Kathlen Mody, PA-C ? ? ?Other Instructions ?N/A ? ?Important Information About Sugar ? ? ? ? ?  ?

## 2021-08-14 ENCOUNTER — Encounter: Payer: Self-pay | Admitting: Cardiology

## 2021-08-14 DIAGNOSIS — R072 Precordial pain: Secondary | ICD-10-CM | POA: Insufficient documentation

## 2021-08-14 NOTE — Assessment & Plan Note (Addendum)
She does have bradycardia noted on EKGs and that was the reason why she came.  Hence I am a little leery of pushing too far with beta-blockers.  We will start low-dose Toprol 12.5 mg daily with plans to titrate up further to 25 mg if tolerated after 1 month. ? ?She really does not want take medications, but I would like to see if we could theoretically not down the PVC burden and potentially reassess while on beta-blocker. ?

## 2021-08-14 NOTE — Assessment & Plan Note (Signed)
Frequent short bursts of 4-6 beat runs of VT.  Completely asymptomatic, but in the setting of 50.6% PVCs, need to exclude ischemic etiology but also probably cardiomyopathy.  We will start with Coronary CTA as noted.  She is having some chest discomfort that may just be the PVCs for short bursts of VT since they are happening with such frequency. ? ?Plan: Coronary CTA, anticipate probably CMR but need to make sure that this would be feasible with her breast implants.  Would defer to EP. ? ?Refer to EP. ?

## 2021-08-14 NOTE — Assessment & Plan Note (Addendum)
Significant PVCs noted on EKG, but about 16% PVCs noted on monitor with extensive PVC couplets triplets and runs between 4-6 beats.  Thankfully, EF still normal on echo with no structural abnormalities noted. ? ?Need to exclude ischemic CAD but probably also will need cardiac MRI-just not sure how accurate these readings will be with her breast implants. ? ?We will start with Coronary CTA for ischemic evaluation, and start low-dose Toprol. ? ?We will also put in referral to EP based on the significant burden of both PVCs and NSVT.  Question possibility of ablation or antiarrhythmic management-she would prefer to avoid long-term medications. ?

## 2021-08-14 NOTE — Assessment & Plan Note (Signed)
She feels a little weird sensation in her chest every now and then about what if this is her baseline noticing the PVCs or short bursts of VT.  However with this much for burden of PVCs and VT.  He did exclude ischemia. ? ?Plan: Coronary CTA ?

## 2021-09-29 ENCOUNTER — Encounter (HOSPITAL_COMMUNITY): Payer: Self-pay

## 2021-10-18 ENCOUNTER — Other Ambulatory Visit
Admission: RE | Admit: 2021-10-18 | Discharge: 2021-10-18 | Disposition: A | Discharge status: Home / Self Care | Payer: BC Managed Care – PPO | Attending: Cardiology | Admitting: Cardiology

## 2021-10-18 DIAGNOSIS — R072 Precordial pain: Secondary | ICD-10-CM | POA: Diagnosis not present

## 2021-10-18 LAB — BASIC METABOLIC PANEL
Anion gap: 8 (ref 5–15)
BUN: 23 mg/dL (ref 8–23)
CO2: 31 mmol/L (ref 22–32)
Calcium: 9.8 mg/dL (ref 8.9–10.3)
Chloride: 102 mmol/L (ref 98–111)
Creatinine, Ser: 0.62 mg/dL (ref 0.44–1.00)
GFR, Estimated: >60 mL/min (ref >60)
Glucose, Bld: 100 mg/dL — ABNORMAL HIGH (ref 70–99)
Potassium: 4 mmol/L (ref 3.5–5.1)
Sodium: 141 mmol/L (ref 135–145)

## 2021-10-19 ENCOUNTER — Telehealth (HOSPITAL_COMMUNITY): Payer: Self-pay | Admitting: Emergency Medicine

## 2021-10-19 NOTE — Telephone Encounter (Signed)
Reaching out to patient to offer assistance regarding upcoming cardiac imaging study; pt verbalizes understanding of appt date/time, parking situation and where to check in, pre-test NPO status and medications ordered, and verified current allergies; name and call back number provided for further questions should they arise Rockwell Alexandria RN Navigator Cardiac Imaging [facility] [phone] office [phone] cell  100mg  metoprolol tartrate  Denies iv issues Arrival [phone]

## 2021-10-20 ENCOUNTER — Ambulatory Visit
Admission: RE | Admit: 2021-10-20 | Discharge: 2021-10-20 | Disposition: A | Discharge status: Home / Self Care | Payer: BC Managed Care – PPO | Source: Ambulatory Visit | Attending: Cardiology | Admitting: Cardiology

## 2021-10-20 DIAGNOSIS — R072 Precordial pain: Secondary | ICD-10-CM | POA: Insufficient documentation

## 2021-10-20 MED ORDER — IOHEXOL 350 MG/ML SOLN
75.0000 mL | Freq: Once | INTRAVENOUS | Status: AC | PRN
  Administered 2021-10-20: 75 mL via INTRAVENOUS

## 2021-10-20 MED ORDER — NITROGLYCERIN 0.4 MG SL SUBL
0.8000 mg | SUBLINGUAL_TABLET | Freq: Once | SUBLINGUAL | Status: AC
  Administered 2021-10-20: 0.8 mg via SUBLINGUAL

## 2021-10-20 NOTE — Progress Notes (Signed)
Patient tolerated procedure well. Ambulate w/o difficulty. Denies light headedness or being dizzy. Sitting in chair drinking water provided. Encouraged to drink extra water today and reasoning explained. Verbalized understanding. All questions answered. ABC intact. No further needs. Discharge from procedure area w/o issues.   °
